# Patient Record
Sex: Female | Born: 1980 | Race: White | Hispanic: Yes | Marital: Single | Smoking: Never smoker
Health system: Southern US, Community
[De-identification: ages and names within clinical notes are randomized; demographics above are authoritative.]

## PROBLEM LIST (undated history)

## (undated) DIAGNOSIS — F329 Major depressive disorder, single episode, unspecified: Secondary | ICD-10-CM

## (undated) DIAGNOSIS — F32A Depression, unspecified: Secondary | ICD-10-CM

---

## 2004-07-22 ENCOUNTER — Emergency Department (HOSPITAL_COMMUNITY): Admission: EM | Admit: 2004-07-22 | Discharge: 2004-07-22 | Payer: Self-pay | Admitting: Emergency Medicine

## 2011-05-30 ENCOUNTER — Emergency Department (HOSPITAL_COMMUNITY)
Admission: EM | Admit: 2011-05-30 | Discharge: 2011-05-31 | Disposition: A | Discharge status: Other Acute Inpatient Hospital | Payer: Self-pay | Source: Home / Self Care | Attending: Emergency Medicine | Admitting: Emergency Medicine

## 2011-05-30 DIAGNOSIS — R42 Dizziness and giddiness: Secondary | ICD-10-CM | POA: Insufficient documentation

## 2011-05-30 DIAGNOSIS — I7774 Dissection of vertebral artery: Secondary | ICD-10-CM | POA: Insufficient documentation

## 2011-05-30 DIAGNOSIS — I6350 Cerebral infarction due to unspecified occlusion or stenosis of unspecified cerebral artery: Secondary | ICD-10-CM | POA: Insufficient documentation

## 2011-05-31 ENCOUNTER — Inpatient Hospital Stay (HOSPITAL_COMMUNITY)
Admission: EM | Admit: 2011-05-31 | Discharge: 2011-06-02 | DRG: 064 | Disposition: A | Discharge status: Home / Self Care | Payer: Self-pay | Source: Ambulatory Visit | Attending: Neurology | Admitting: Neurology

## 2011-05-31 ENCOUNTER — Emergency Department (HOSPITAL_COMMUNITY): Payer: Self-pay

## 2011-05-31 ENCOUNTER — Encounter (HOSPITAL_COMMUNITY): Payer: Self-pay

## 2011-05-31 ENCOUNTER — Inpatient Hospital Stay (HOSPITAL_COMMUNITY): Payer: Self-pay

## 2011-05-31 DIAGNOSIS — I6350 Cerebral infarction due to unspecified occlusion or stenosis of unspecified cerebral artery: Principal | ICD-10-CM | POA: Diagnosis present

## 2011-05-31 DIAGNOSIS — I7774 Dissection of vertebral artery: Secondary | ICD-10-CM | POA: Diagnosis present

## 2011-05-31 LAB — CBC
HCT: 38.7 % (ref 36.0–46.0)
HCT: 36.8 % (ref 36.0–46.0)
Hemoglobin: 12.5 g/dL (ref 12.0–15.0)
Hemoglobin: 12.2 g/dL (ref 12.0–15.0)
MCH: 28.5 pg (ref 26.0–34.0)
MCH: 29.1 pg (ref 26.0–34.0)
MCHC: 32.3 g/dL (ref 30.0–36.0)
MCHC: 33.2 g/dL (ref 30.0–36.0)
MCV: 88.4 fL (ref 78.0–100.0)
MCV: 87.8 fL (ref 78.0–100.0)
Platelets: 356 10*3/uL (ref 150–400)
Platelets: 342 10*3/uL (ref 150–400)
RBC: 4.38 MIL/uL (ref 3.87–5.11)
RBC: 4.19 MIL/uL (ref 3.87–5.11)
RDW: 13.4 % (ref 11.5–15.5)
RDW: 13.9 % (ref 11.5–15.5)
WBC: 12.2 10*3/uL — ABNORMAL HIGH (ref 4.0–10.5)
WBC: 10.6 10*3/uL — ABNORMAL HIGH (ref 4.0–10.5)

## 2011-05-31 LAB — COMPREHENSIVE METABOLIC PANEL
ALT: 18 U/L (ref 0–35)
ALT: 14 U/L (ref 0–35)
AST: 15 U/L (ref 0–37)
AST: 14 U/L (ref 0–37)
Albumin: 3.7 g/dL (ref 3.5–5.2)
Albumin: 3.5 g/dL (ref 3.5–5.2)
Alkaline Phosphatase: 71 U/L (ref 39–117)
Alkaline Phosphatase: 68 U/L (ref 39–117)
BUN: 14 mg/dL (ref 6–23)
BUN: 12 mg/dL (ref 6–23)
CO2: 28 mEq/L (ref 19–32)
CO2: 28 mEq/L (ref 19–32)
Calcium: 9.5 mg/dL (ref 8.4–10.5)
Calcium: 9.2 mg/dL (ref 8.4–10.5)
Chloride: 100 mEq/L (ref 96–112)
Chloride: 100 mEq/L (ref 96–112)
Creatinine, Ser: 0.53 mg/dL (ref 0.50–1.10)
Creatinine, Ser: 0.59 mg/dL (ref 0.50–1.10)
GFR calc Af Amer: >90 mL/min (ref >90)
GFR calc Af Amer: >90 mL/min (ref >90)
GFR calc non Af Amer: >90 mL/min (ref >90)
GFR calc non Af Amer: >90 mL/min (ref >90)
Glucose, Bld: 135 mg/dL — ABNORMAL HIGH (ref 70–99)
Glucose, Bld: 100 mg/dL — ABNORMAL HIGH (ref 70–99)
Potassium: 3.9 mEq/L (ref 3.5–5.1)
Potassium: 3.6 mEq/L (ref 3.5–5.1)
Sodium: 137 mEq/L (ref 135–145)
Sodium: 137 mEq/L (ref 135–145)
Total Bilirubin: 0.1 mg/dL — ABNORMAL LOW (ref 0.3–1.2)
Total Bilirubin: 0.2 mg/dL — ABNORMAL LOW (ref 0.3–1.2)
Total Protein: 7.8 g/dL (ref 6.0–8.3)
Total Protein: 7.3 g/dL (ref 6.0–8.3)

## 2011-05-31 LAB — URINALYSIS, ROUTINE W REFLEX MICROSCOPIC
Bilirubin Urine: NEGATIVE
Glucose, UA: 100 mg/dL — AB
Hgb urine dipstick: NEGATIVE
Leukocytes, UA: NEGATIVE
Nitrite: NEGATIVE
Protein, ur: NEGATIVE mg/dL
Specific Gravity, Urine: 1.024 (ref 1.005–1.030)
Urobilinogen, UA: 0.2 mg/dL (ref 0.0–1.0)
pH: 6.5 (ref 5.0–8.0)

## 2011-05-31 LAB — CARDIAC PANEL(CRET KIN+CKTOT+MB+TROPI)
CK, MB: 1.8 ng/mL (ref 0.3–4.0)
Relative Index: INVALID (ref 0.0–2.5)
Total CK: 51 U/L (ref 7–177)
Troponin I: <0.3 ng/mL (ref <0.30)

## 2011-05-31 LAB — DIFFERENTIAL
Basophils Absolute: 0 10*3/uL (ref 0.0–0.1)
Basophils Relative: 0 % (ref 0–1)
Eosinophils Absolute: 0 10*3/uL (ref 0.0–0.7)
Eosinophils Relative: 0 % (ref 0–5)
Lymphocytes Relative: 6 % — ABNORMAL LOW (ref 12–46)
Lymphs Abs: 0.7 10*3/uL (ref 0.7–4.0)
Monocytes Absolute: 0.4 10*3/uL (ref 0.1–1.0)
Monocytes Relative: 3 % (ref 3–12)
Neutro Abs: 11.1 10*3/uL — ABNORMAL HIGH (ref 1.7–7.7)
Neutrophils Relative %: 91 % — ABNORMAL HIGH (ref 43–77)

## 2011-05-31 LAB — APTT: aPTT: 32 seconds (ref 24–37)

## 2011-05-31 LAB — POCT PREGNANCY, URINE: Preg Test, Ur: NEGATIVE

## 2011-05-31 LAB — PROTIME-INR
INR: 1.02 (ref 0.00–1.49)
Prothrombin Time: 13.6 seconds (ref 11.6–15.2)

## 2011-05-31 LAB — GLUCOSE, CAPILLARY: Glucose-Capillary: 99 mg/dL (ref 70–99)

## 2011-05-31 MED ORDER — IOHEXOL 350 MG/ML SOLN
100.0000 mL | Freq: Once | INTRAVENOUS | Status: AC | PRN
  Administered 2011-05-31: 100 mL via INTRAVENOUS

## 2011-06-01 ENCOUNTER — Inpatient Hospital Stay (HOSPITAL_COMMUNITY): Payer: Self-pay

## 2011-06-01 LAB — LUPUS ANTICOAGULANT PANEL
DRVVT: 35.6 secs (ref 34.1–42.2)
PTT Lupus Anticoagulant: 37.7 secs (ref 28.0–43.0)

## 2011-06-01 LAB — CARDIOLIPIN ANTIBODIES, IGG, IGM, IGA
Anticardiolipin IgA: 5 APL U/mL — ABNORMAL LOW (ref <22)
Anticardiolipin IgM: 2 MPL U/mL — ABNORMAL LOW (ref <11)

## 2011-06-01 LAB — LIPID PANEL
Cholesterol: 157 mg/dL (ref 0–200)
Total CHOL/HDL Ratio: 3.5 RATIO
Triglycerides: 106 mg/dL (ref <150)
VLDL: 21 mg/dL (ref 0–40)

## 2011-06-01 LAB — ANTITHROMBIN III: AntiThromb III Func: 91 % (ref 76–126)

## 2011-06-01 LAB — GLUCOSE, CAPILLARY
Glucose-Capillary: 86 mg/dL (ref 70–99)
Glucose-Capillary: 96 mg/dL (ref 70–99)

## 2011-06-01 LAB — MRSA PCR SCREENING: MRSA by PCR: NEGATIVE

## 2011-06-02 DIAGNOSIS — I6789 Other cerebrovascular disease: Secondary | ICD-10-CM

## 2011-06-02 LAB — RPR: RPR Ser Ql: NONREACTIVE

## 2011-06-02 LAB — C3 COMPLEMENT: C3 Complement: 121 mg/dL (ref 90–180)

## 2011-06-03 LAB — COMPLEMENT, TOTAL: Compl, Total (CH50): >60 U/mL — ABNORMAL HIGH (ref 31–60)

## 2011-06-03 LAB — GLUCOSE, CAPILLARY: Glucose-Capillary: 94 mg/dL (ref 70–99)

## 2011-06-05 NOTE — H&P (Signed)
Katie Braun, Katie Braun NO.:  [id number]  MEDICAL RECORD NO.:  [id number]  LOCATION:  3007                         FACILITY:  [facility]  PHYSICIAN:  Thana Farr, MD    DATE OF BIRTH:  07-Feb-1981  DATE OF ADMISSION:  05/31/2011 DATE OF DISCHARGE:                             HISTORY & PHYSICAL   TIME:  1:37.  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  HISTORY OF PRESENT ILLNESS:  This is a 30 year old Hispanic female who was brought to [facility] after waking in the morning of May 30, 2011, at approximately 5 a.m. and noting significant dizziness and veering to the right side.  In addition, the patient also had multiple bouts of nausea and vomiting.  Upon admission to [facility], the patient obtained a CT of head, which showed an acute evolving infarct of much for left cerebellar hemisphere without evidence of hemorrhagic transformation and mild mass effect.  In addition, a CTA of her neck was obtained, which showed an acute left vertebral artery dissection in the proximal V2 segment, which is occluded, but did show irregular reconstruction of flow distally.  At that time, Neurology was consulted and the patient was transferred to [facility] for further evaluation and admission.  PAST MEDICAL HISTORY:  None.  MEDICATIONS:  The patient only takes Tylenol for headache at home.  ALLERGIES:  None.  SOCIAL HISTORY:  The patient does not smoke.  She does drink occasionally.  She does not use illicit drugs.  REVIEW OF SYSTEMS:  Negative with the exception of above.  PHYSICAL EXAMINATION:  Blood pressure is 117/62, pulse 64, respirations 17, temperature 97.9.  Mental status; the patient is alert and oriented. She carries out 2 and 3 step commands without any difficulty.  Pupils are equal, round, and reactive to light.  She has conjugate gaze. Extraocular movements are intact.  She does  show some positive lateral nystagmus with a fast pace to the left, slow pace to the right.  Visual fields are grossly intact.  Face appears symmetrical.  Tongue is midline.  Uvula is midline.  Interpreter who is at bedside as the patient does not speak English did not note any abnormality in speech. Facial sensation is grossly intact V1 through V3.  The patient does complain of the neck pain, most notably in the left posterior portion. The patient's coordination, finger-to-nose, and heel-to-shin were otherwise smooth.  Fine motor movements were smooth.  The patient did veer to the right when I stood her up and had difficulty sitting straight up at her bedside.  The patient's motor was 5/5 throughout. Her deep tendon reflexes were 2+.  Downgoing toes bilaterally.  The patient had no drift in the upper or lower extremities.  Sensation was full to pinprick and light touch.  Cardiovascular; S1 and S2 was audible.  Pulmonary was clear to auscultation.  LABORATORY DATA:  UA is negative.  Sodium is 137, potassium 3.9, chloride is 100, CO2 is 28, BUN 14, creatinine 0.53, glucose 135.  White blood cell count is 12.2, platelets 356, hemoglobin 12.5, hematocrit 38.7.  IMAGING:  As noted above.  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    PLAN: 1. Admit the patient to 3100 for closer monitoring.   2. Obtain an MRI of the brain 3. 2D echo.    Dr. Pearlean Brownie recommends start ASA now and obtain CT head in AM.  Dr. Pearlean Brownie will follow in the morning.       Felicie Morn, PA-C   ______________________________ Thana Farr, MD    DS/MEDQ  D:  05/31/2011  T:  05/31/2011  Job:  [id number]  Electronically Signed by Felicie Morn PA-C on 06/02/2011 10:51:36 AM Electronically Signed by Thana Farr MD on 06/05/2011 05:27:32 PM

## 2011-06-06 NOTE — Consult Note (Signed)
  [name], [name]:  [id number]  MEDICAL RECORD NO.:  [id number]  LOCATION:  3105                         FACILITY:  [facility]  PHYSICIAN:  Noralyn Pick. Eden Emms, MD, FACCDATE OF BIRTH:  [birth year]  DATE OF CONSULTATION: DATE OF DISCHARGE:  06/02/2011                                TEE   A 30 year old patient admitted to the hospital with left vertebral artery dissection and stroke.  Transesophageal echo was done to rule out source of embolus.  The patient was sedated with 6 mg of Versed and 50 mcg of Fentanyl.  Using digital technique, an Omniplane probe was advanced in to distal esophagus without incident.  Transgastric imaging revealed normal LV function with an EF of 60%.  There are no regional wall motion abnormalities and no mural apical thrombus.  Mitral valve was structurally normal.  Left atrium and right-sided cardiac chambers were normal.  There was only trivial TR.  Aortic valve with trileaflet normal.  Aortic root was normal.  Left atrial appendage was normal with no spontaneous contrast and no thrombus.  Bubble study was negative for the left-to-right shunt.  There was no ASD or PFO. Imaging of the aorta showed no debris.  IMPRESSION: 1. Normal transesophageal echo. 2. Ejection fraction 60%. 3. Negative bubble study for right-to-left shunt. 4. Normal aorta. 5. No left atrial appendage clot.  There was no source of embolus.  The patient tolerated the procedure well.  We had the advantage of having a very good Spanish interpreter for the case.     Noralyn Pick. Eden Emms, MD, [facility]     PCN/MEDQ  D:  06/02/2011  T:  06/02/2011  Job:  [id number]  Electronically Signed by Charlton Haws MD [facility] on 06/06/2011 09:07:13 AM

## 2011-06-07 LAB — PROTHROMBIN GENE MUTATION

## 2011-06-09 NOTE — Discharge Summary (Signed)
Katie Braun, Katie Braun              ACCOUNT NO.:  [id number]  MEDICAL RECORD NO.:  [id number]  LOCATION:  3105                         FACILITY:  [facility]  PHYSICIAN:  Dr. 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  ADMISSION DIAGNOSIS:  Left posterior inferior cerebellar infarct due to left vertebral artery dissection.  DISCHARGE MEDICATIONS: 1. Plavix 75 mg a day. 2. Aspirin 81 mg a day.  HISTORY OF PRESENT ILLNESS:  [name] is a 30 year old Hispanic female, who presented to [facility] on May 31, 2011, with complaints of dizziness since May 30, 2011, around 5:00 am. This was accompanied by multiple bouts of nausea and vomiting.  CT of the head showed acute evolving infarct involving left cerebellum without evidence of hemorrhagic transformation and mild mass effect. CTA of the neck showed acute left vertebral artery dissection with proximal V2 segment occlusion and reconstitution distally.  There was dissection and luminal thrombus continued into the V4 segment.  The patient was transferred to [facility] for Neuro ICU admission.  She will require MRI of the brain, hyper-coag panel, PT/OT and speech therapy.  COURSE IN HOSPITAL:  [name] was transferred from [facility] to [facility] ICU for management of left cerebellar infarct and acute left vertebral artery dissection.  Language barrier was addressed via use of a Spanish interpreter for interactions.  SCDs were used for VTE prophylaxis.  The patient was initiated on aspirin and Plavix therapy (aspirin started on May 31, 2011).  LDL was 91, therefore, not requiring lipid-lowering medication.  The patient was seen and  examined by Physical Therapy, Speech and Occupational Therapy.  The patient passed her stroke swallow evaluation, was tolerating diet well. Physical Therapy felt the patient was motivated, but limited due to weakness.  They recommended Home Health physical therapy at discharge for vestibular/balance followup.  On June 02, 2011, the patient underwent TEE to rule out PFO.  This was a normal study with no bubbles seen.  Hyper-coag panel was not entirely back at the time of discharge, but complements were within normal limits and RPR was negative.  Cardiolipins were low and glycoproteins were normal.  MRI on June 01, 2011, showed moderate- sized infarct of the left cerebellum and borderline on the vermis. There was a question of possible miniscule hemorrhagic transformation without mass effect.  Otherwise, no intracranial hemorrhage seen.  The patient will felt stable for discharge to home with Home Health Physical Therapy follow up.  She is alert and oriented x3 with normal speech and language.  EOMIs are full.  Face is symmetric.  She has no focal weakness and no dysmetria.  She will need to see Dr. Pearlean Brownie in 2 months for followup.  She will need to follow up with her primary care provider.     Luan Moore, P.A.   ______________________________ Dr.  Koleson Reifsteck    TCJ/MEDQ  D:  06/02/2011  T:  06/03/2011  Job:  [id number]  Electronically Signed by Delice Bison JERNEJCIC P.A. on 06/05/2011 10:08:20 AM Electronically Signed by Delia Heady MD on 06/09/2011 10:28:16 PM

## 2013-07-01 ENCOUNTER — Encounter (HOSPITAL_COMMUNITY): Payer: Self-pay

## 2013-07-01 ENCOUNTER — Other Ambulatory Visit (HOSPITAL_COMMUNITY): Payer: Self-pay | Admitting: *Deleted

## 2013-07-01 DIAGNOSIS — N6452 Nipple discharge: Secondary | ICD-10-CM

## 2013-07-01 DIAGNOSIS — N644 Mastodynia: Secondary | ICD-10-CM

## 2013-07-02 ENCOUNTER — Encounter (INDEPENDENT_AMBULATORY_CARE_PROVIDER_SITE_OTHER): Payer: Self-pay

## 2013-07-02 ENCOUNTER — Encounter (HOSPITAL_COMMUNITY): Payer: Self-pay

## 2013-07-02 ENCOUNTER — Ambulatory Visit (HOSPITAL_COMMUNITY)
Admission: RE | Admit: 2013-07-02 | Discharge: 2013-07-02 | Disposition: A | Discharge status: Home / Self Care | Payer: Self-pay | Source: Ambulatory Visit | Attending: Obstetrics and Gynecology | Admitting: Obstetrics and Gynecology

## 2013-07-02 VITALS — BP 108/64 | Temp 98.1°F | Ht 63.0 in | Wt 185.4 lb

## 2013-07-02 DIAGNOSIS — Z1239 Encounter for other screening for malignant neoplasm of breast: Secondary | ICD-10-CM

## 2013-07-02 DIAGNOSIS — N644 Mastodynia: Secondary | ICD-10-CM | POA: Insufficient documentation

## 2013-07-02 HISTORY — DX: Depression, unspecified: F32.A

## 2013-07-02 HISTORY — DX: Major depressive disorder, single episode, unspecified: F32.9

## 2013-07-02 NOTE — Patient Instructions (Signed)
Taught Katie Braun how to perform BSE and gave educational materials to take home. Patient did not need a Pap smear today due to last Pap smear was 05/24/2013. Let her know BCCCP will cover Pap smears every 3 years unless has a history of abnormal Pap smears. Referred patient to the [facility] for diagnostic mammogram. Appointment scheduled for Tuesday, July 16, 2013 at 1445. Patient aware of appointment and will be there.  [name] verbalized understanding.  Iori Gigante, Kathaleen Maser, RN 1:57 PM

## 2013-07-02 NOTE — Progress Notes (Signed)
Complaints of bilateral breast tenderness with pain in left breast under areola. Patient rated pain under left breast areola at a 8 out 10.  Pap Smear:   Pap smear not completed today. Last Pap smear was 05/24/2013 at the [facility] and normal. Per patient has no history of an abnormal Pap smear.  Pap smear result is scanned in EPIC under media.  Physical exam: Breasts Breasts symmetrical. No skin abnormalities bilateral breasts. No nipple retraction bilateral breasts. No nipple discharge bilateral breasts. No lymphadenopathy. No lumps palpated bilateral breasts. Complaints of left breast pain under areola.      Referred patient to the [facility] for diagnostic mammogram. Appointment scheduled for Tuesday, July 16, 2013 at 1445.  Pelvic/Bimanual No Pap smear completed today since last Pap smear was 05/24/2013. Pap smear not indicated per BCCCP guidelines.

## 2013-07-16 ENCOUNTER — Other Ambulatory Visit (HOSPITAL_COMMUNITY): Payer: Self-pay | Admitting: Obstetrics and Gynecology

## 2013-07-16 ENCOUNTER — Ambulatory Visit
Admission: RE | Admit: 2013-07-16 | Discharge: 2013-07-16 | Disposition: A | Discharge status: Home / Self Care | Payer: No Typology Code available for payment source | Source: Ambulatory Visit | Attending: Obstetrics and Gynecology | Admitting: Obstetrics and Gynecology

## 2013-07-16 DIAGNOSIS — N6452 Nipple discharge: Secondary | ICD-10-CM

## 2013-07-16 DIAGNOSIS — N644 Mastodynia: Secondary | ICD-10-CM

## 2017-07-21 ENCOUNTER — Encounter (HOSPITAL_COMMUNITY): Payer: Self-pay

## 2021-04-21 ENCOUNTER — Other Ambulatory Visit: Payer: Self-pay

## 2021-04-21 DIAGNOSIS — N6320 Unspecified lump in the left breast, unspecified quadrant: Secondary | ICD-10-CM

## 2021-05-18 ENCOUNTER — Ambulatory Visit
Admission: RE | Admit: 2021-05-18 | Discharge: 2021-05-18 | Disposition: A | Discharge status: Home / Self Care | Payer: Self-pay | Source: Ambulatory Visit | Attending: Obstetrics and Gynecology | Admitting: Obstetrics and Gynecology

## 2021-05-18 ENCOUNTER — Ambulatory Visit: Payer: Self-pay | Admitting: *Deleted

## 2021-05-18 ENCOUNTER — Encounter (INDEPENDENT_AMBULATORY_CARE_PROVIDER_SITE_OTHER): Payer: Self-pay

## 2021-05-18 ENCOUNTER — Other Ambulatory Visit: Payer: Self-pay

## 2021-05-18 VITALS — BP 120/86 | Wt 184.1 lb

## 2021-05-18 DIAGNOSIS — N6320 Unspecified lump in the left breast, unspecified quadrant: Secondary | ICD-10-CM

## 2021-05-18 DIAGNOSIS — N644 Mastodynia: Secondary | ICD-10-CM

## 2021-05-18 DIAGNOSIS — Z01419 Encounter for gynecological examination (general) (routine) without abnormal findings: Secondary | ICD-10-CM

## 2021-05-18 NOTE — Patient Instructions (Signed)
Explained breast self awareness with [name]. Pap smear completed today. Let her know BCCCP will cover Pap smears and HPV typing every 5 years unless has a history of abnormal Pap smears. Referred patient to the [facility] for a diagnostic mammogram. Appointment scheduled Tuesday, May 18, 2021 at 1400. Patient aware of appointment and will be there. Let patient know will follow up with her within the next couple weeks with results of Pap smear by phone. Katie Braun verbalized understanding.  Shaida Route, Kathaleen Maser, RN 11:34 AM

## 2021-05-18 NOTE — Progress Notes (Signed)
[name] is a 40 y.o. G0P0 female who presents to [facility] clinic today with complaint of left breast lump and pain x 2 months. Patient states the pain comes and goes. Patient rates the pain at a 6-7 out of 10.    Pap Smear: Pap smear completed today. Last Pap smear was 05/24/2013 at [facility] clinic and was normal. Per patient has no history of an abnormal Pap smear. Last Pap smear result is available in Epic.   Physical exam: Breasts Breasts symmetrical. No skin abnormalities bilateral breasts. No nipple retraction bilateral breasts. No nipple discharge bilateral breasts. No lymphadenopathy. No lumps palpated bilateral breasts. Unable to palpate a lump in patients area of concern within the left breast. Complaints of left outer and upper breast pain on exam.      Pelvic/Bimanual Ext Genitalia No lesions, no swelling and no discharge observed on external genitalia.        Vagina Vagina pink and normal texture. No lesions or discharge observed in vagina.        Cervix Cervix is present. Cervix pink and of normal texture. Blood observed at cervical os consistent with the start of patients menstrual period. Per patient she is due for her menstrual period.   Uterus Uterus is present and palpable. Uterus in normal position and normal size.        Adnexae Bilateral ovaries present and palpable. No tenderness on palpation.         Rectovaginal No rectal exam completed today since patient had no rectal complaints. No skin abnormalities observed on exam.     Smoking History: Patient has never smoked.   Patient Navigation: Patient education provided. Access to services provided for patient through [place] program. Spanish interpreter Natale Lay from [facility] provided.    Breast and Cervical Cancer Risk Assessment: Patient does not have family history of breast cancer, known genetic mutations, or radiation treatment to the chest before age 45. Patient does not  have history of cervical dysplasia, immunocompromised, or DES exposure in-utero.  Risk Assessment     Risk Scores       05/18/2021   Last edited by: Narda Rutherford, LPN   5-year risk: 0.4 %   Lifetime risk: 7.1 %            A: BCCCP exam with pap smear Complaint of left breast lump and pain.  P: Referred patient to the [facility] for a diagnostic mammogram. Appointment scheduled Tuesday, May 18, 2021 at 1400.  Priscille Heidelberg, RN 05/18/2021 11:34 AM

## 2021-05-23 LAB — CYTOLOGY - PAP
Comment: NEGATIVE
Diagnosis: NEGATIVE
Diagnosis: REACTIVE
High risk HPV: NEGATIVE

## 2021-05-26 ENCOUNTER — Telehealth: Payer: Self-pay

## 2021-05-26 NOTE — Telephone Encounter (Signed)
Via Julie Sowell, Spanish Interpreter ([place]), Patient informed negative Pap/HPV results, next pap due in 5 years. Patient verbalized understanding.  

## 2022-07-25 IMAGING — US US BREAST*L* LIMITED INC AXILLA
1 series · 2 of 2 positions shown · non-contrast
Comparison: Previous exam(s).

CLINICAL DATA: 40-year-old female presenting for evaluation of 2
months of intermittent left breast pain.

EXAM:
DIGITAL DIAGNOSTIC BILATERAL MAMMOGRAM WITH TOMOSYNTHESIS AND CAD;
ULTRASOUND LEFT BREAST LIMITED
TECHNIQUE: Bilateral digital diagnostic mammography and breast tomosynthesis
was performed. The images were evaluated with computer-aided
detection.; Targeted ultrasound examination of the left breast was
performed.

[Series 1: us breast*left* limited inc axilla · 0.06mm/px · 2 of 2 slices shown]
[im 1/2]
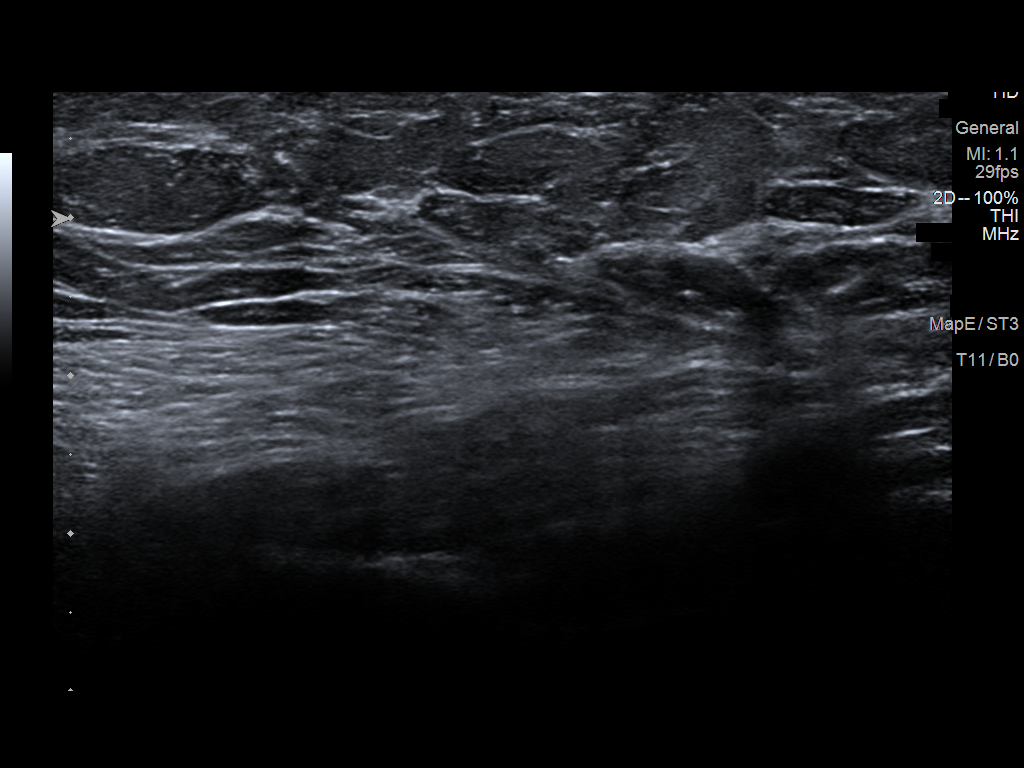
[im 2/2]
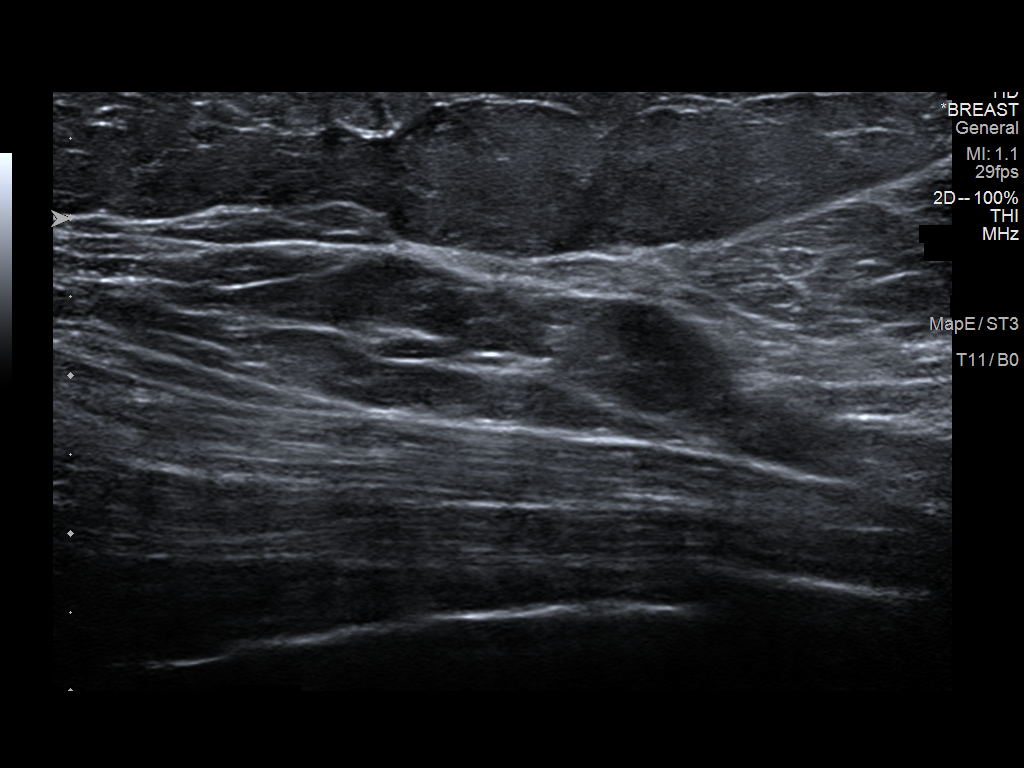

[2 of 2 positions shown; findings below may reference images not displayed]

ACR Breast Density Category b: There are scattered areas of
fibroglandular density.
FINDINGS: A BB indicating the site of pain has been placed along the
upper-outer quadrant of the left breast. No suspicious mammographic
findings are identified deep to the skin marker.

Ultrasound targeted to the area of pain demonstrates normal
fibroglandular tissue. No suspicious masses or areas of shadowing
are identified.
IMPRESSION: 1. There are no suspicious mammographic or targeted sonographic
abnormalities in the region of pain in the left breast.

2.  No mammographic evidence of malignancy in the bilateral breasts.

RECOMMENDATION:
1.  Screening mammogram in one year.(Code:5M-8-YGY)

2. Clinical follow-up recommended for the tender area of concern in
the left breast. Any further workup should be based on clinical
grounds.

I have discussed the findings and recommendations with the patient.
If applicable, a reminder letter will be sent to the patient
regarding the next appointment.

BI-RADS CATEGORY  1: Negative.

## 2022-07-25 IMAGING — MG DIGITAL DIAGNOSTIC BILAT W/ TOMO W/ CAD
6 of 10 series · 6 of 30 positions shown · non-contrast
Comparison: Previous exam(s).

CLINICAL DATA: 40-year-old female presenting for evaluation of 2
months of intermittent left breast pain.

EXAM:
DIGITAL DIAGNOSTIC BILATERAL MAMMOGRAM WITH TOMOSYNTHESIS AND CAD;
ULTRASOUND LEFT BREAST LIMITED
TECHNIQUE: Bilateral digital diagnostic mammography and breast tomosynthesis
was performed. The images were evaluated with computer-aided
detection.; Targeted ultrasound examination of the left breast was
performed.

[R CC synth-2D]
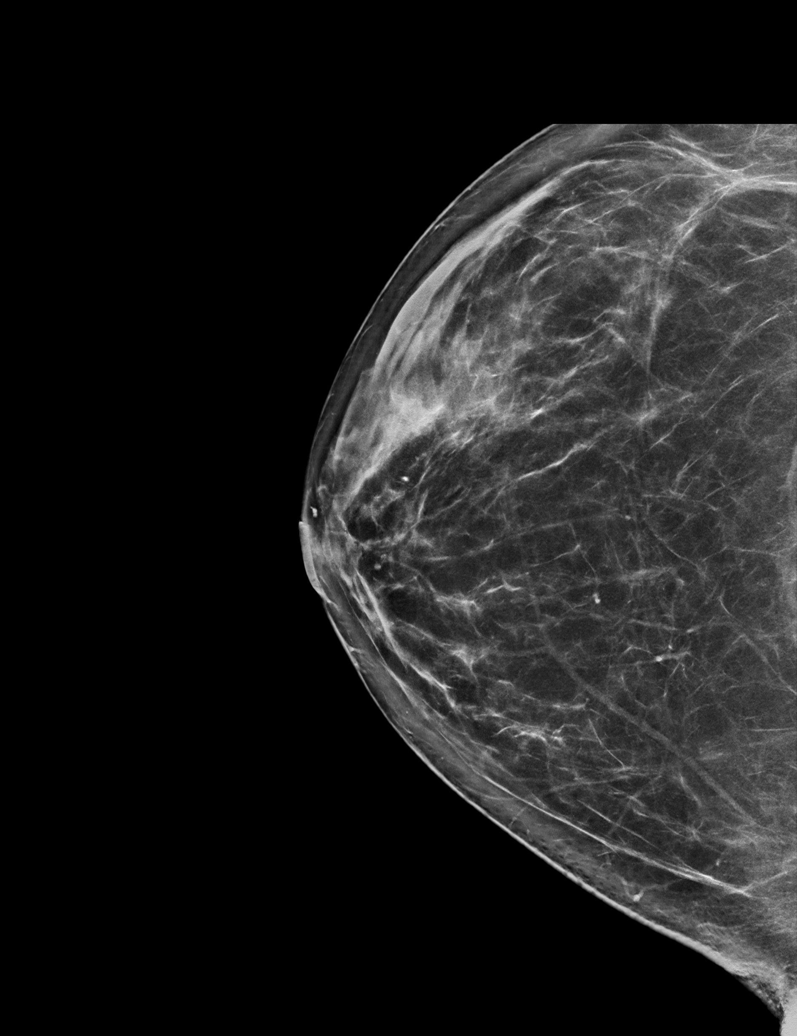

[R MLO synth-2D]
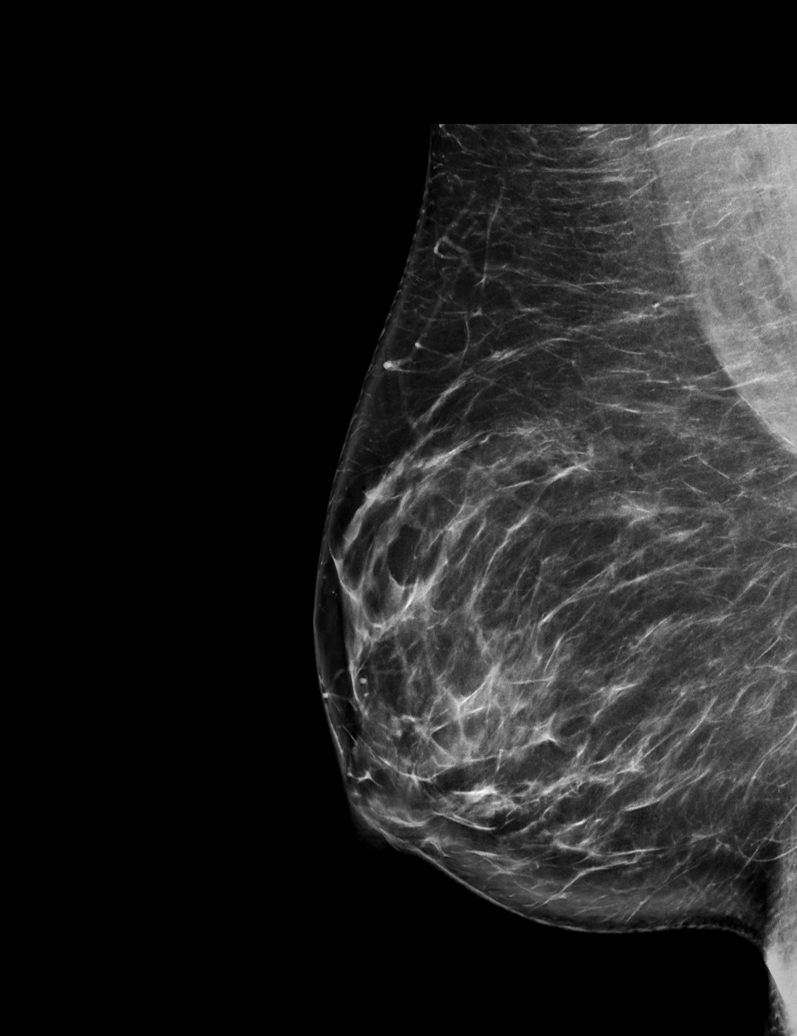

[L TAN synth-2D]
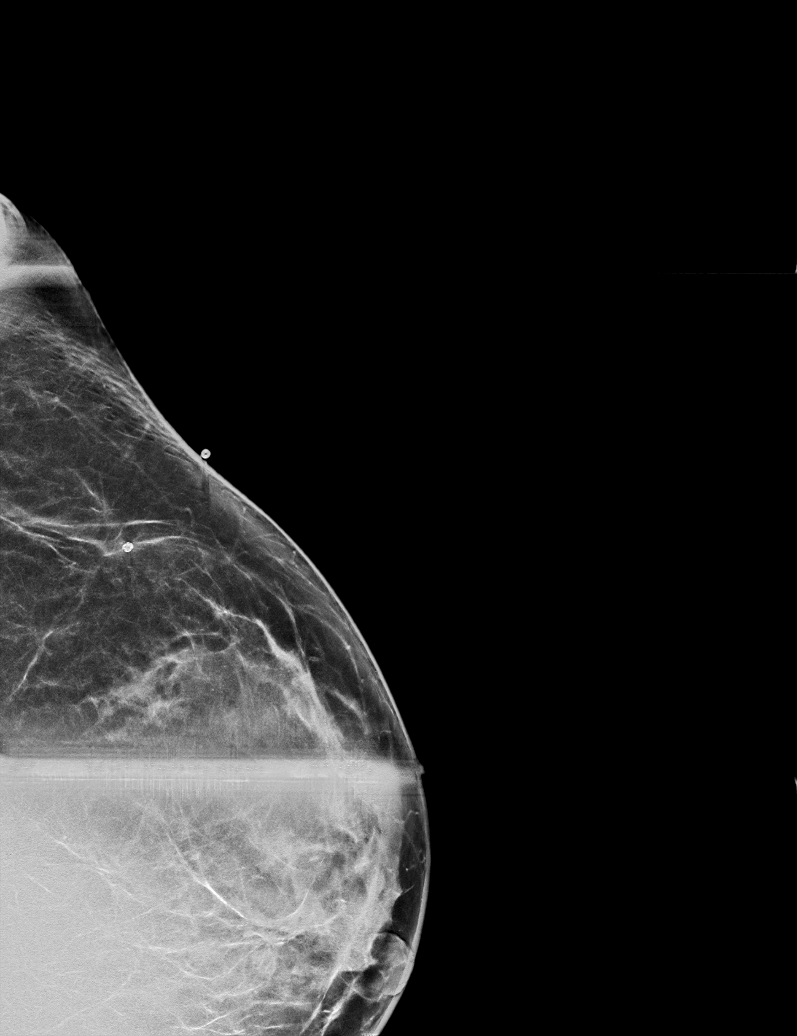

[L CC synth-2D]
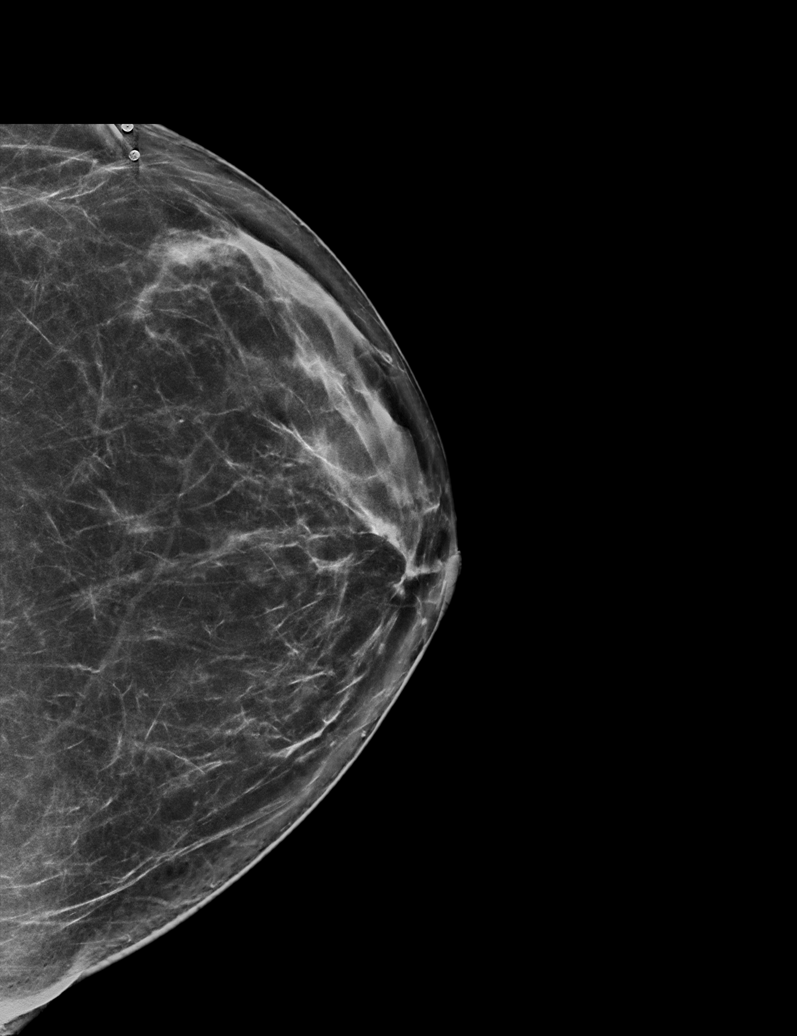

[L MLO synth-2D]
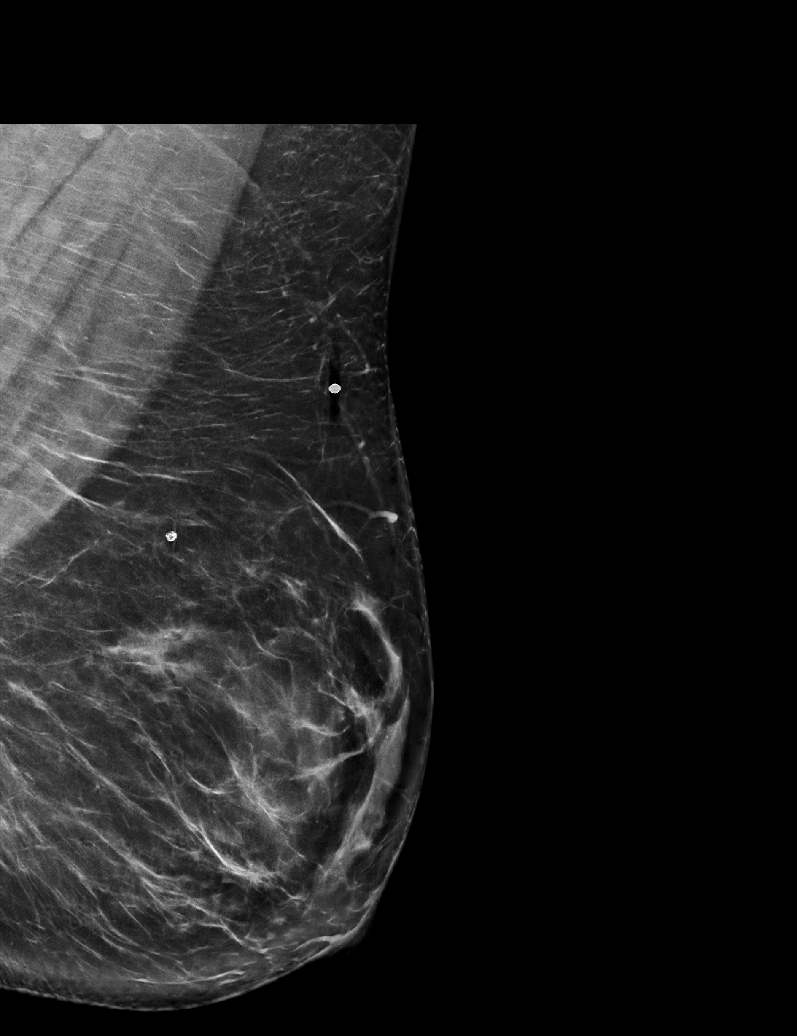

[L CC tomo · tomo slice 38/75.0]
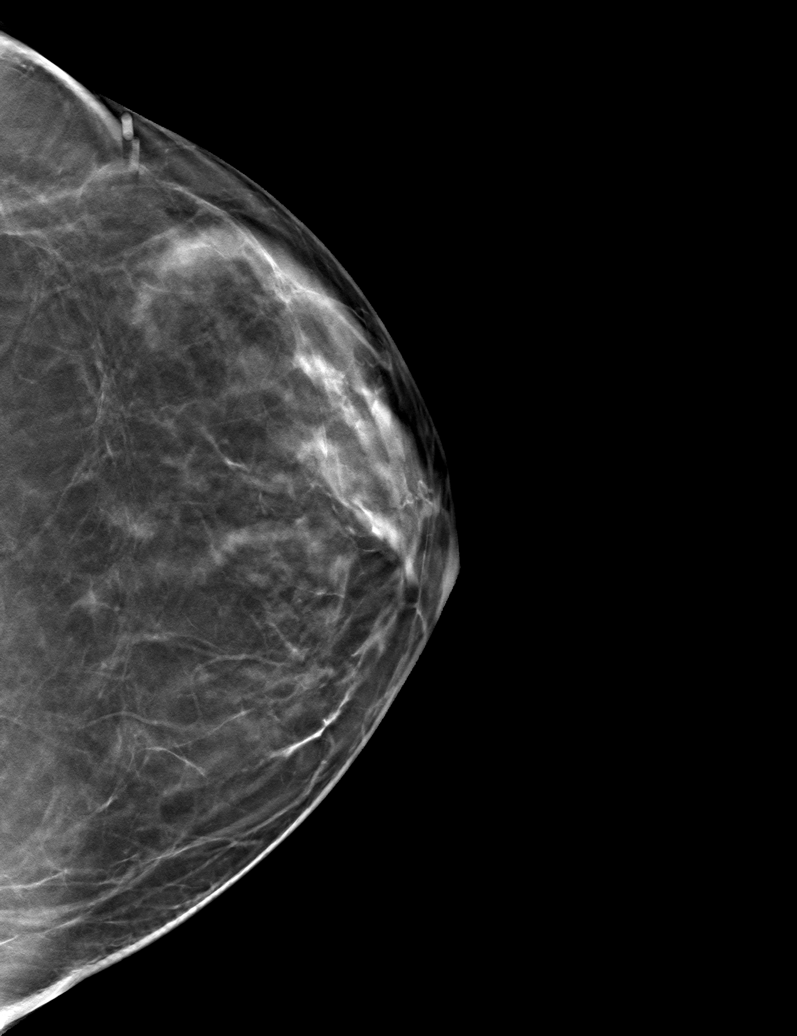

[6 of 30 positions shown; findings below may reference images not displayed]

ACR Breast Density Category b: There are scattered areas of
fibroglandular density.
FINDINGS: A BB indicating the site of pain has been placed along the
upper-outer quadrant of the left breast. No suspicious mammographic
findings are identified deep to the skin marker.

Ultrasound targeted to the area of pain demonstrates normal
fibroglandular tissue. No suspicious masses or areas of shadowing
are identified.
IMPRESSION: 1. There are no suspicious mammographic or targeted sonographic
abnormalities in the region of pain in the left breast.

2.  No mammographic evidence of malignancy in the bilateral breasts.

RECOMMENDATION:
1.  Screening mammogram in one year.(Code:5M-8-YGY)

2. Clinical follow-up recommended for the tender area of concern in
the left breast. Any further workup should be based on clinical
grounds.

I have discussed the findings and recommendations with the patient.
If applicable, a reminder letter will be sent to the patient
regarding the next appointment.

BI-RADS CATEGORY  1: Negative.
